# Patient Record
Sex: Male | Born: 2000 | Hispanic: Yes | Marital: Single | Smoking: Never smoker
Health system: Southern US, Community
[De-identification: ages and names within clinical notes are randomized; demographics above are authoritative.]

## PROBLEM LIST (undated history)

## (undated) HISTORY — PX: MYRINGOTOMY: SUR874

---
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
## 2023-04-10 ENCOUNTER — Encounter (HOSPITAL_COMMUNITY): Payer: Self-pay | Admitting: *Deleted

## 2023-04-10 ENCOUNTER — Other Ambulatory Visit: Payer: Self-pay

## 2023-04-10 ENCOUNTER — Ambulatory Visit (HOSPITAL_COMMUNITY)
Admission: EM | Admit: 2023-04-10 | Discharge: 2023-04-10 | Disposition: A | Discharge status: Home / Self Care | Payer: Medicaid Other | Attending: Internal Medicine | Admitting: Internal Medicine

## 2023-04-10 DIAGNOSIS — S46812A Strain of other muscles, fascia and tendons at shoulder and upper arm level, left arm, initial encounter: Secondary | ICD-10-CM

## 2023-04-10 MED ORDER — PREDNISONE 10 MG PO TABS: 40.0000 mg | ORAL_TABLET | Freq: Every day | ORAL | 0 refills | Status: AC

## 2023-04-10 MED ORDER — DEXAMETHASONE SODIUM PHOSPHATE 10 MG/ML IJ SOLN
INTRAMUSCULAR | Status: AC
Start: 2023-04-10 — End: ?
  Filled 2023-04-10: qty 1

## 2023-04-10 MED ORDER — DEXAMETHASONE SODIUM PHOSPHATE 10 MG/ML IJ SOLN
10.0000 mg | Freq: Once | INTRAMUSCULAR | Status: AC
  Administered 2023-04-10: 10 mg via INTRAMUSCULAR

## 2023-04-10 MED ORDER — CYCLOBENZAPRINE HCL 10 MG PO TABS: 10.0000 mg | ORAL_TABLET | Freq: Two times a day (BID) | ORAL | 0 refills | Status: AC | PRN
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
## 2023-04-10 NOTE — Discharge Instructions (Addendum)
Most likely Trapezius Muscle strain causing spasms given the location. Will treat with the following:  Decadron injection given today for inflammation. Start Prednisone 40mg  (4 tablets) in the morning with breakfast. Start this tomorrow 11/26 Cyclobenzaprine (Flexeril) 10mg  every 12 hours as needed for muscle spasms Rest as needed Light stretching and may use a heating pad to help with soreness Return to urgent care or PCP if symptoms worsen or fail to resolve.
